# Patient Record
Sex: Female | Born: 1990 | Race: Black or African American | Hispanic: No | Marital: Single | Smoking: Never smoker
Health system: Southern US, Community
[De-identification: ages and names within clinical notes are randomized; demographics above are authoritative.]

---
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
## 2009-01-28 ENCOUNTER — Ambulatory Visit: Payer: Self-pay | Admitting: Internal Medicine

## 2011-01-04 ENCOUNTER — Ambulatory Visit: Payer: Self-pay | Admitting: Family Medicine

## 2015-12-29 ENCOUNTER — Ambulatory Visit
Admission: EM | Admit: 2015-12-29 | Discharge: 2015-12-29 | Disposition: A | Discharge status: Home / Self Care | Payer: BC Managed Care – PPO | Attending: Family Medicine | Admitting: Family Medicine

## 2015-12-29 ENCOUNTER — Encounter: Payer: Self-pay | Admitting: Emergency Medicine

## 2015-12-29 ENCOUNTER — Ambulatory Visit (INDEPENDENT_AMBULATORY_CARE_PROVIDER_SITE_OTHER): Payer: BC Managed Care – PPO

## 2015-12-29 DIAGNOSIS — R071 Chest pain on breathing: Secondary | ICD-10-CM | POA: Diagnosis not present

## 2015-12-29 DIAGNOSIS — R079 Chest pain, unspecified: Secondary | ICD-10-CM | POA: Diagnosis present

## 2015-12-29 DIAGNOSIS — F419 Anxiety disorder, unspecified: Secondary | ICD-10-CM | POA: Insufficient documentation

## 2015-12-29 DIAGNOSIS — Z6841 Body Mass Index (BMI) 40.0 and over, adult: Secondary | ICD-10-CM | POA: Diagnosis not present

## 2015-12-29 DIAGNOSIS — M940 Chondrocostal junction syndrome [Tietze]: Secondary | ICD-10-CM | POA: Diagnosis not present

## 2015-12-29 DIAGNOSIS — R0602 Shortness of breath: Secondary | ICD-10-CM | POA: Insufficient documentation

## 2015-12-29 DIAGNOSIS — E669 Obesity, unspecified: Secondary | ICD-10-CM | POA: Insufficient documentation

## 2015-12-29 LAB — COMPREHENSIVE METABOLIC PANEL
ALBUMIN: 3.5 g/dL (ref 3.5–5.0)
ALK PHOS: 66 U/L (ref 38–126)
ALT: 14 U/L (ref 14–54)
AST: 24 U/L (ref 15–41)
Anion gap: 3 — ABNORMAL LOW (ref 5–15)
BILIRUBIN TOTAL: 0.3 mg/dL (ref 0.3–1.2)
BUN: 14 mg/dL (ref 6–20)
CALCIUM: 8.9 mg/dL (ref 8.9–10.3)
CO2: 21 mmol/L — AB (ref 22–32)
CREATININE: 0.8 mg/dL (ref 0.44–1.00)
Chloride: 109 mmol/L (ref 101–111)
GFR calc non Af Amer: >60 mL/min (ref >60)
GLUCOSE: 104 mg/dL — AB (ref 65–99)
Potassium: 3.8 mmol/L (ref 3.5–5.1)
SODIUM: 133 mmol/L — AB (ref 135–145)
TOTAL PROTEIN: 7.3 g/dL (ref 6.5–8.1)

## 2015-12-29 LAB — CBC WITH DIFFERENTIAL/PLATELET
Basophils Absolute: 0 10*3/uL (ref 0–0.1)
Basophils Relative: 1 %
EOS ABS: 0.1 10*3/uL (ref 0–0.7)
Eosinophils Relative: 2 %
HEMATOCRIT: 37.6 % (ref 35.0–47.0)
Hemoglobin: 12.6 g/dL (ref 12.0–16.0)
LYMPHS ABS: 2.2 10*3/uL (ref 1.0–3.6)
Lymphocytes Relative: 33 %
MCH: 27.6 pg (ref 26.0–34.0)
MCHC: 33.5 g/dL (ref 32.0–36.0)
MCV: 82.2 fL (ref 80.0–100.0)
MONOS PCT: 6 %
Monocytes Absolute: 0.4 10*3/uL (ref 0.2–0.9)
NEUTROS ABS: 3.9 10*3/uL (ref 1.4–6.5)
NEUTROS PCT: 58 %
Platelets: 244 10*3/uL (ref 150–440)
RBC: 4.57 MIL/uL (ref 3.80–5.20)
RDW: 14.2 % (ref 11.5–14.5)
WBC: 6.6 10*3/uL (ref 3.6–11.0)

## 2015-12-29 LAB — CKMB (ARMC ONLY): CK, MB: 2.6 ng/mL (ref 0.5–5.0)

## 2015-12-29 LAB — CK: Total CK: 364 U/L — ABNORMAL HIGH (ref 38–234)

## 2015-12-29 LAB — TROPONIN I: Troponin I: <0.03 ng/mL (ref <0.031)

## 2015-12-29 LAB — FIBRIN DERIVATIVES D-DIMER (ARMC ONLY): Fibrin derivatives D-dimer (ARMC): 464 (ref 0–499)

## 2015-12-29 MED ORDER — MELOXICAM 15 MG PO TABS: 15.0000 mg | ORAL_TABLET | Freq: Every day | ORAL | Status: DC

## 2015-12-29 MED ORDER — KETOROLAC TROMETHAMINE 60 MG/2ML IM SOLN
60.0000 mg | Freq: Once | INTRAMUSCULAR | Status: AC
  Administered 2015-12-29: 60 mg via INTRAMUSCULAR

## 2015-12-29 NOTE — Discharge Instructions (Signed)
Chest Wall Pain Chest wall pain is pain in or around the bones and muscles of your chest. Sometimes, an injury causes this pain. Sometimes, the cause may not be known. This pain may take several weeks or longer to get better. HOME CARE Pay attention to any changes in your symptoms. Take these actions to help with your pain:  Rest as told by your doctor.  Avoid activities that cause pain. Try not to use your chest, belly (abdominal), or side muscles to lift heavy things.  If directed, apply ice to the painful area:  Put ice in a plastic bag.  Place a towel between your skin and the bag.  Leave the ice on for 20 minutes, 2-3 times per day.  Take over-the-counter and prescription medicines only as told by your doctor.  Do not use tobacco products, including cigarettes, chewing tobacco, and e-cigarettes. If you need help quitting, ask your doctor.  Keep all follow-up visits as told by your doctor. This is important. GET HELP IF:  You have a fever.  Your chest pain gets worse.  You have new symptoms. GET HELP RIGHT AWAY IF:  You feel sick to your stomach (nauseous) or you throw up (vomit).  You feel sweaty or light-headed.  You have a cough with phlegm (sputum) or you cough up blood.  You are short of breath.   This information is not intended to replace advice given to you by your health care provider. Make sure you discuss any questions you have with your health care provider.   Document Released: 03/02/2008 Document Revised: 06/05/2015 Document Reviewed: 12/10/2014 Elsevier Interactive Patient Education 2016 [employer]  Costochondritis Costochondritis is a condition in which the tissue (cartilage) that connects your ribs with your breastbone (sternum) becomes irritated. It causes pain in the chest and rib area. It usually goes away on its own over time. HOME CARE  Avoid activities that wear you out.  Do not strain your ribs. Avoid activities that use  your:  Chest.  Belly.  Side muscles.  Put ice on the area for the first 2 days after the pain starts.  Put ice in a plastic bag.  Place a towel between your skin and the bag.  Leave the ice on for 20 minutes, 2-3 times a day.  Only take medicine as told by your doctor. GET HELP IF:  You have redness or puffiness (swelling) in the rib area.  Your pain does not go away with rest or medicine. GET HELP RIGHT AWAY IF:   Your pain gets worse.  You are very uncomfortable.  You have trouble breathing.  You cough up blood.  You start sweating or throwing up (vomiting).  You have a fever or lasting symptoms for more than 2-3 days.  You have a fever and your symptoms suddenly get worse. MAKE SURE YOU:   Understand these instructions.  Will watch your condition.  Will get help right away if you are not doing well or get worse.   This information is not intended to replace advice given to you by your health care provider. Make sure you discuss any questions you have with your health care provider.   Document Released: 03/02/2008 Document Revised: 05/17/2013 Document Reviewed: 04/18/2013 Elsevier Interactive Patient Education 2016 [employer] Inc[employer]  Nonspecific Chest Pain It is often hard to find the cause of chest pain. There is always a chance that your pain could be related to something serious, such as a heart attack or a blood clot  in your lungs. Chest pain can also be caused by conditions that are not life-threatening. If you have chest pain, it is very important to follow up with your doctor.  HOME CARE  If you were prescribed an antibiotic medicine, finish it all even if you start to feel better.  Avoid any activities that cause chest pain.  Do not use any tobacco products, including cigarettes, chewing tobacco, or electronic cigarettes. If you need help quitting, ask your doctor.  Do not drink alcohol.  Take medicines only as told by your doctor.  Keep all  follow-up visits as told by your doctor. This is important. This includes any further testing if your chest pain does not go away.  Your doctor may tell you to keep your head raised (elevated) while you sleep.  Make lifestyle changes as told by your doctor. These may include:  Getting regular exercise. Ask your doctor to suggest some activities that are safe for you.  Eating a heart-healthy diet. Your doctor or a diet specialist (dietitian) can help you to learn healthy eating options.  Maintaining a healthy weight.  Managing diabetes, if necessary.  Reducing stress. GET HELP IF:  Your chest pain does not go away, even after treatment.  You have a rash with blisters on your chest.  You have a fever. GET HELP RIGHT AWAY IF:  Your chest pain is worse.  You have an increasing cough, or you cough up blood.  You have severe belly (abdominal) pain.  You feel extremely weak.  You pass out (faint).  You have chills.  You have sudden, unexplained chest discomfort.  You have sudden, unexplained discomfort in your arms, back, neck, or jaw.  You have shortness of breath at any time.  You suddenly start to sweat, or your skin gets clammy.  You feel nauseous.  You vomit.  You suddenly feel light-headed or dizzy.  Your heart begins to beat quickly, or it feels like it is skipping beats. These symptoms may be an emergency. Do not wait to see if the symptoms will go away. Get medical help right away. Call your local emergency services (911 in the [place] Do not drive yourself to the hospital.   This information is not intended to replace advice given to you by your health care provider. Make sure you discuss any questions you have with your health care provider.   Document Released: 03/02/2008 Document Revised: 10/05/2014 Document Reviewed: 04/20/2014 Elsevier Interactive Patient Education [employer]

## 2015-12-29 NOTE — ED Notes (Signed)
Patient c/o tightness in the chest since 9pm last night.  Patient also reports back pain.  Patient denies N/V.  Patient denies SOB or difficulty breathing.

## 2015-12-29 NOTE — ED Provider Notes (Signed)
CSN: [id number]     Arrival date & time 12/29/15  [id number] History   First MD Initiated Contact with Patient 12/29/15 [phone]       patient's here because of chest pain. She states that she's had some chest discomfort and pain in the past but never like this. About 9:00 last night she started having chest pain started a nap before her evening job. The pain has been on the left side radiating back of her neck and has also gone down the back of her back 2 or lower back and then back up into her chest. There is some discomfort when she takes deep breaths and some interference with her breathing. The pain does not go down the arm the pain has occurred at rest as well.   According to her and her mother she's been under a lot of stress she's had a lot of anxiety lately but nothing specific for the wound to discuss this time. 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 There is no history of sudden death or cardiac death or sniffing cardiac problems in the immediate family. She denies being pregnant.   she still reports some shortness of breath when she takes deep breaths. 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  Chief Complaint  Patient presents with  . Chest Pain  . Anxiety   (Consider location/radiation/quality/duration/timing/severity/associated sxs/prior Treatment) Patient is a 25 y.o. female presenting with chest pain and anxiety. The history is provided by the patient. No language interpreter was used.  Chest Pain Pain location:  Substernal area and L chest Pain quality: pressure, radiating, sharp and shooting   Pain radiates to:  Lower back, upper back and mid back Pain radiates to the back: yes   Pain severity:  Moderate Onset quality:  Sudden Duration:  12 hours Timing:  Intermittent Progression:  Worsening Chronicity:   New Context: breathing, at rest and stress   Context: no trauma   Relieved by:  Nothing Worsened by:  Nothing tried Associated symptoms: anxiety   Risk factors: obesity   Risk factors: no hypertension, no immobilization, not female, not pregnant, no prior DVT/PE, no smoking and no surgery   Anxiety Associated symptoms include chest pain.    History reviewed. No pertinent past medical history. History reviewed. No pertinent past surgical history. History reviewed. No pertinent family history. Social History  Substance Use Topics  . Smoking status: Never Smoker   . Smokeless tobacco: None  . Alcohol Use: Yes   OB History    No data available     Review of Systems  Cardiovascular: Positive for chest pain.    Allergies  Review of patient's allergies indicates no known allergies.  Home Medications   Prior to Admission medications   Medication Sig Start Date End Date Taking? Authorizing Provider  meloxicam (MOBIC) 15 MG tablet Take 1 tablet (15 mg total) by mouth daily. 12/29/15   Hassan RowanEugene Zahira Brummond, MD   Meds Ordered and Administered this Visit   Medications  ketorolac (TORADOL) injection 60 mg (60 mg Intramuscular Given 12/29/15 0923)    BP 117/74 mmHg  Pulse 74  Temp(Src) 97.9 F (36.6 C) (Tympanic)  Resp 16  Ht 5\' 4"  (1.626 m)  Wt 250 lb (113.399 kg)  BMI 42.89 kg/m2  SpO2 100%  LMP 12/22/2015 (Approximate) No data found.  Physical Exam  Constitutional: She is oriented to person, place, and time. She appears well-developed and well-nourished.  HENT:  Head: Normocephalic and atraumatic.  Eyes: Conjunctivae are normal. Pupils are equal, round, and reactive to light.  Neck: Normal range of motion.  Cardiovascular: Normal rate, regular rhythm and normal heart sounds.   Pulmonary/Chest: Effort normal and breath sounds normal. She exhibits tenderness.     Patient is marked tenderness over the left ribs and left sternum. Lungs are clear  Abdominal: Soft. Bowel sounds  are normal.  Musculoskeletal: Normal range of motion. She exhibits no edema.  Neurological: She is alert and oriented to person, place, and time. No cranial nerve deficit.  Skin: Skin is warm and dry. No erythema.  Vitals reviewed.   ED Course  Procedures (including critical care time)  Labs Review Labs Reviewed  COMPREHENSIVE METABOLIC PANEL - Abnormal; Notable for the following:    Sodium 133 (*)    CO2 21 (*)    Glucose, Bld 104 (*)    Anion gap 3 (*)    All other components within normal limits  CK - Abnormal; Notable for the following:    Total CK 364 (*)    All other components within normal limits  CBC WITH DIFFERENTIAL/PLATELET  TROPONIN I  FIBRIN DERIVATIVES D-DIMER ([facility] ONLY)  CKMB([facility] ONLY)    Imaging Review Dg Chest 2 View  12/29/2015  CLINICAL DATA:  Acute left-sided chest pain. EXAM: CHEST  2 VIEW COMPARISON:  None. FINDINGS: The heart size and mediastinal contours are within normal limits. Both lungs are clear. No pneumothorax or pleural effusion is noted. The visualized skeletal structures are unremarkable. IMPRESSION: No active cardiopulmonary disease. Electronically Signed   By: Lupita Raider, M.D.   On: 12/29/2015 09:54     Visual Acuity Review  Right Eye Distance:   Left Eye Distance:   Bilateral Distance:    Right Eye Near:   Left Eye Near:    Bilateral Near:         MDM   1. Chest pain on breathing   2. Costochondritis       patient with costochondritis will obtain chest x-ray cardiac enzymes and d-dimer  CBC and CMP.  Will administer Toradol 60 mg IM as well. EKG showed normal sinus rhythm .  clinical patient that d-dimer was negative CK was elevated indicating that she may have had some chest wall injury but CK-MB were negative as well as troponin. No signs of any  Cardiopulmonary problems on chest x-ray either. We'll place on Mobic 15 mg 1 tablet day should be noted that the 6 mg of Toradol IM did help with the chest wall pain. Will  give a work note for today and tomorrow and have careful which does work transporting patients. Follow-up PCP.  ED ECG REPORT I, Eraina Winnie H, the attending physician, personally viewed and interpreted this ECG.   Date: 12/29/2015  EKG Time:08:42:41  Rate:70  Rhythm: there are no previous tracings available for comparison, normal sinus rhythm  Axis:20  Intervals:none  ST&T Change: none   normal sinus rhythm was some mild sinus arrhythmia       Hassan Rowan, MD 12/29/15 1055

## 2017-03-12 IMAGING — CR DG CHEST 2V
2 series · 2 of 2 positions shown · non-contrast
Comparison: None.

CLINICAL DATA: Acute left-sided chest pain.

EXAM:
CHEST  2 VIEW

[chest pa]
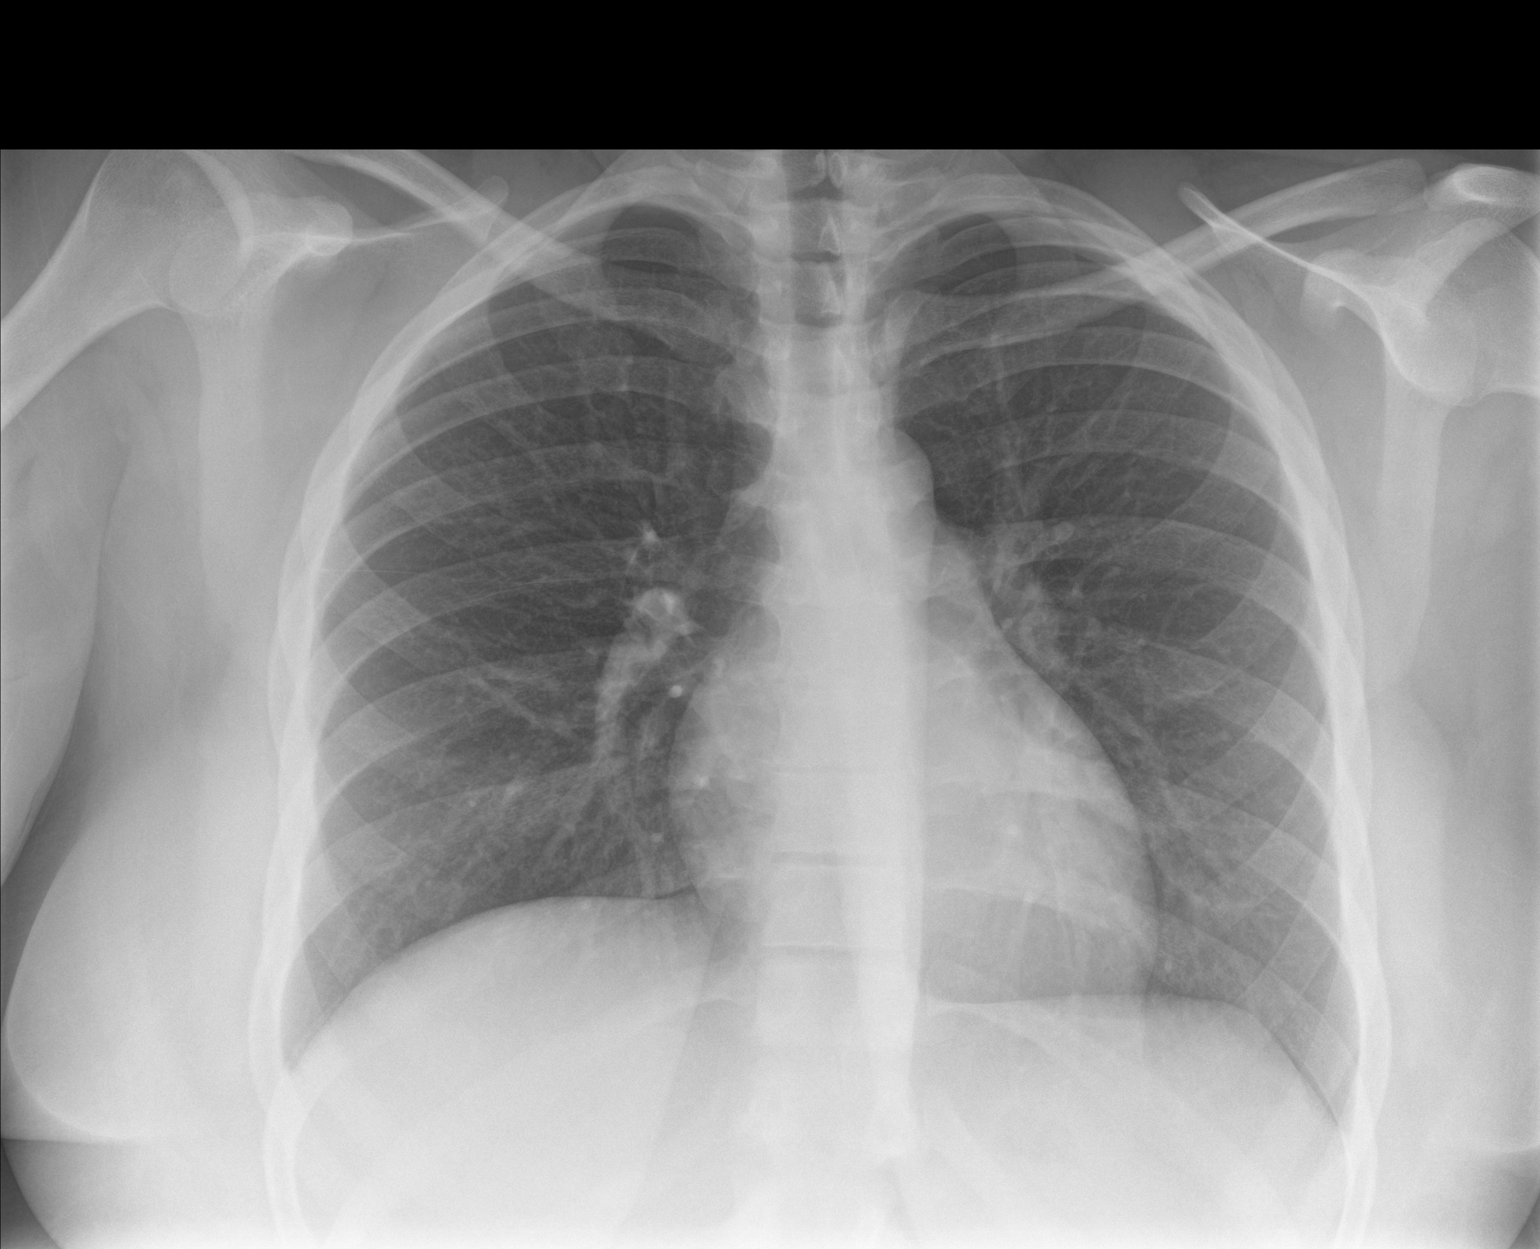

[chest lat]
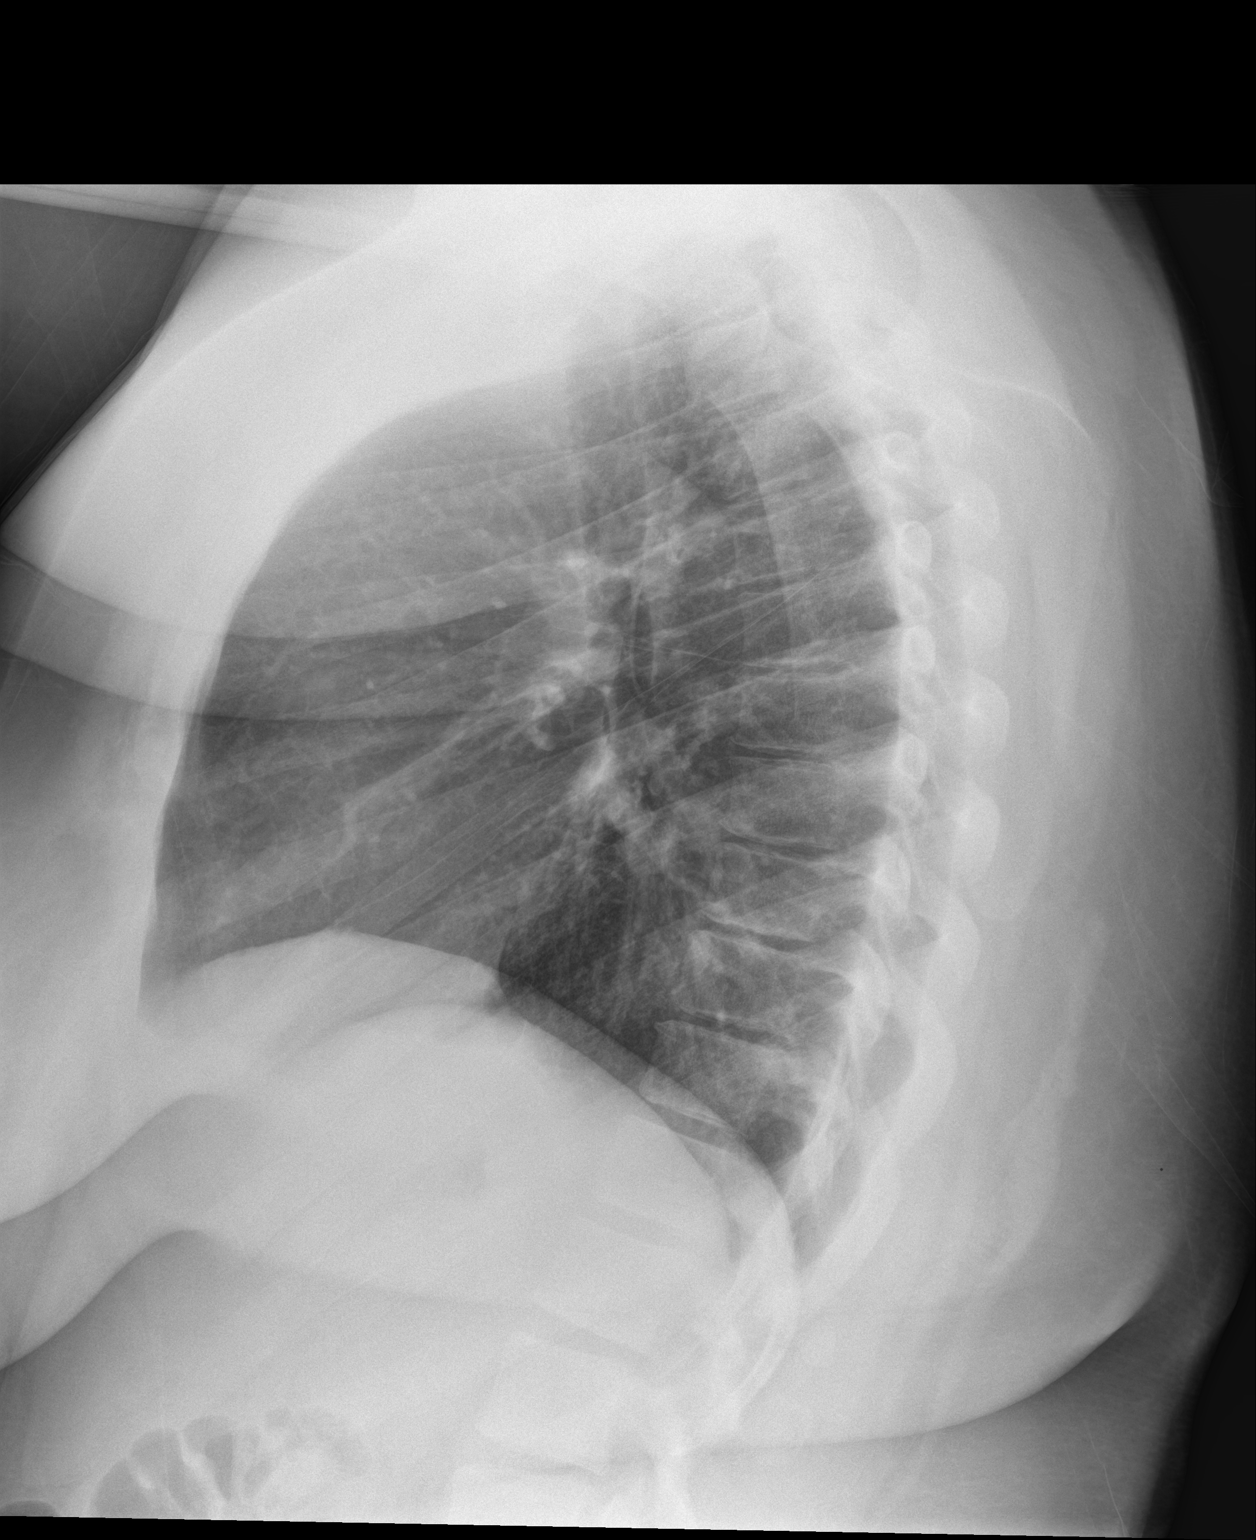

[2 of 2 positions shown; findings below may reference images not displayed]

FINDINGS: The heart size and mediastinal contours are within normal limits.
Both lungs are clear. No pneumothorax or pleural effusion is noted.
The visualized skeletal structures are unremarkable.
IMPRESSION: No active cardiopulmonary disease.

## 2017-11-02 ENCOUNTER — Ambulatory Visit
Admission: EM | Admit: 2017-11-02 | Discharge: 2017-11-02 | Disposition: A | Discharge status: Home / Self Care | Payer: BC Managed Care – PPO | Attending: Family Medicine | Admitting: Family Medicine

## 2017-11-02 ENCOUNTER — Encounter: Payer: Self-pay | Admitting: *Deleted

## 2017-11-02 DIAGNOSIS — J111 Influenza due to unidentified influenza virus with other respiratory manifestations: Secondary | ICD-10-CM | POA: Diagnosis not present

## 2017-11-02 DIAGNOSIS — R05 Cough: Secondary | ICD-10-CM | POA: Diagnosis not present

## 2017-11-02 MED ORDER — ACETAMINOPHEN 500 MG PO TABS
1000.0000 mg | ORAL_TABLET | Freq: Once | ORAL | Status: AC
  Administered 2017-11-02: 1000 mg via ORAL

## 2017-11-02 MED ORDER — OSELTAMIVIR PHOSPHATE 75 MG PO CAPS: 75.0000 mg | ORAL_CAPSULE | Freq: Two times a day (BID) | ORAL | 0 refills | Status: AC

## 2017-11-02 NOTE — ED Triage Notes (Signed)
Cough, fever, body aches, onset yesterday.

## 2017-11-02 NOTE — ED Provider Notes (Signed)
[facility]   CSN: [id number] Arrival date & time: 11/02/17  1657  History   Chief Complaint Chief Complaint  Patient presents with  . Fever  . Cough  . Generalized Body Aches  . Sore Throat   HPI   27 year old female presents with the above complaints.  Symptoms started yesterday.  She reports fever, T-max 101.7.  Cough, sore throat, body aches.  Her sister was just diagnosed with the flu.  She works in Teacher, music.  No medications or interventions tried.  No known exacerbating relieving factors.  No other associated symptoms.  No other complaints at this time.  Social History Social History   Tobacco Use  . Smoking status: Never Smoker  . Smokeless tobacco: Never Used  Substance Use Topics  . Alcohol use: Yes  . Drug use: No     Allergies   Patient has no known allergies.   Review of Systems Review of Systems  Constitutional: Positive for fever.  HENT: Positive for sore throat.   Respiratory: Positive for cough.   Musculoskeletal:       Body aches.   Physical Exam Triage Vital Signs ED Triage Vitals  Enc Vitals Group     BP 11/02/17 1731 127/82     Pulse Rate 11/02/17 1731 (!) 101     Resp 11/02/17 1731 16     Temp 11/02/17 1731 (!) 100.8 F (38.2 C)     Temp Source 11/02/17 1731 Oral     SpO2 11/02/17 1731 100 %     Weight 11/02/17 1732 260 lb (117.9 kg)     Height 11/02/17 1732 5\' 4"  (1.626 m)     Head Circumference --      Peak Flow --      Pain Score 11/02/17 1927 0     Pain Loc --      Pain Edu? --      Excl. in GC? --    Updated Vital Signs BP 127/82 (BP Location: Left Arm)   Pulse (!) 101   Temp (!) 100.8 F (38.2 C) (Oral)   Resp 16   Ht 5\' 4"  (1.626 m)   Wt 260 lb (117.9 kg)   SpO2 100%   BMI 44.63 kg/m    Physical Exam  Constitutional: She is oriented to person, place, and time. She appears well-developed and well-nourished. No distress.  HENT:  Head: Normocephalic and atraumatic.  Mouth/Throat: Oropharynx is clear  and moist.  Eyes: Conjunctivae are normal. Right eye exhibits no discharge. Left eye exhibits no discharge.  Cardiovascular:  Tachycardic.  Regular rhythm.  No murmur.  Pulmonary/Chest: Effort normal and breath sounds normal. She has no wheezes. She has no rales.  Neurological: She is alert and oriented to person, place, and time.  Psychiatric: She has a normal mood and affect. Her behavior is normal.  Nursing note and vitals reviewed.  UC Treatments / Results  Labs (all labs ordered are listed, but only abnormal results are displayed) Labs Reviewed  RAPID STREP SCREEN (NOT AT [facility])    EKG  EKG Interpretation None       Radiology No results found.  Procedures Procedures (including critical care time)  Medications Ordered in UC Medications  acetaminophen (TYLENOL) tablet 1,000 mg (1,000 mg Oral Given 11/02/17 1738)     Initial Impression / Assessment and Plan / UC Course  I have reviewed the triage vital signs and the nursing notes.  Pertinent labs & imaging results that were available during my care  of the patient were reviewed by me and considered in my medical decision making (see chart for details).    27 year old female presents with influenza.  Treating with Tamiflu.   Final Clinical Impressions(s) / UC Diagnoses   Final diagnoses:  Influenza    ED Discharge Orders        Ordered    oseltamivir (TAMIFLU) 75 MG capsule  Every 12 hours     11/02/17 1921     Controlled Substance Prescriptions  Controlled Substance Registry consulted? Not Applicable   Tommie SamsCook, Jesua Tamblyn G, DO 11/02/17 2020
